# Patient Record
Sex: Male | Born: 1985 | Race: White | Hispanic: No | Marital: Married | Smoking: Former smoker
Health system: Southern US, Community
[De-identification: ages and names within clinical notes are randomized; demographics above are authoritative.]

## PROBLEM LIST (undated history)

## (undated) DIAGNOSIS — F419 Anxiety disorder, unspecified: Secondary | ICD-10-CM

## (undated) HISTORY — DX: Anxiety disorder, unspecified: F41.9

---

## 2016-10-15 ENCOUNTER — Encounter: Payer: Self-pay | Admitting: Gastroenterology

## 2016-11-05 ENCOUNTER — Other Ambulatory Visit: Payer: Self-pay

## 2016-11-24 ENCOUNTER — Telehealth: Payer: Self-pay | Admitting: Gastroenterology

## 2016-11-24 ENCOUNTER — Ambulatory Visit: Payer: Self-pay | Admitting: Gastroenterology

## 2016-11-24 NOTE — Telephone Encounter (Signed)
OK this time.

## 2016-11-24 NOTE — Telephone Encounter (Signed)
Do you want to charge? 

## 2017-01-05 ENCOUNTER — Ambulatory Visit: Payer: Self-pay | Admitting: Gastroenterology

## 2017-02-20 ENCOUNTER — Emergency Department (HOSPITAL_COMMUNITY)
Admission: EM | Admit: 2017-02-20 | Discharge: 2017-02-20 | Disposition: A | Discharge status: Home / Self Care | Payer: BLUE CROSS/BLUE SHIELD | Attending: Emergency Medicine | Admitting: Emergency Medicine

## 2017-02-20 ENCOUNTER — Encounter (HOSPITAL_COMMUNITY): Payer: Self-pay

## 2017-02-20 ENCOUNTER — Emergency Department (HOSPITAL_COMMUNITY): Payer: BLUE CROSS/BLUE SHIELD

## 2017-02-20 DIAGNOSIS — R0602 Shortness of breath: Secondary | ICD-10-CM | POA: Diagnosis present

## 2017-02-20 DIAGNOSIS — Z87891 Personal history of nicotine dependence: Secondary | ICD-10-CM | POA: Insufficient documentation

## 2017-02-20 DIAGNOSIS — R002 Palpitations: Secondary | ICD-10-CM | POA: Diagnosis not present

## 2017-02-20 LAB — BASIC METABOLIC PANEL WITH GFR
Anion gap: 10 (ref 5–15)
BUN: 20 mg/dL (ref 6–20)
CO2: 23 mmol/L (ref 22–32)
Calcium: 9.2 mg/dL (ref 8.9–10.3)
Chloride: 105 mmol/L (ref 101–111)
Creatinine, Ser: 0.91 mg/dL (ref 0.61–1.24)
GFR calc Af Amer: >60 mL/min (ref >60)
GFR calc non Af Amer: >60 mL/min (ref >60)
Glucose, Bld: 115 mg/dL — ABNORMAL HIGH (ref 65–99)
Potassium: 3.1 mmol/L — ABNORMAL LOW (ref 3.5–5.1)
Sodium: 138 mmol/L (ref 135–145)

## 2017-02-20 LAB — URINALYSIS, ROUTINE W REFLEX MICROSCOPIC
BILIRUBIN URINE: NEGATIVE
GLUCOSE, UA: NEGATIVE mg/dL
Hgb urine dipstick: NEGATIVE
KETONES UR: NEGATIVE mg/dL
Leukocytes, UA: NEGATIVE
Nitrite: NEGATIVE
PH: 6 (ref 5.0–8.0)
Protein, ur: NEGATIVE mg/dL
SPECIFIC GRAVITY, URINE: 1.012 (ref 1.005–1.030)

## 2017-02-20 LAB — HEPATIC FUNCTION PANEL
ALK PHOS: 40 U/L (ref 38–126)
ALT: 18 U/L (ref 17–63)
AST: 20 U/L (ref 15–41)
Albumin: 4.1 g/dL (ref 3.5–5.0)
BILIRUBIN DIRECT: 0.2 mg/dL (ref 0.1–0.5)
BILIRUBIN INDIRECT: 1.4 mg/dL — AB (ref 0.3–0.9)
Total Bilirubin: 1.6 mg/dL — ABNORMAL HIGH (ref 0.3–1.2)
Total Protein: 6.6 g/dL (ref 6.5–8.1)

## 2017-02-20 LAB — CBC
HCT: 42 % (ref 39.0–52.0)
Hemoglobin: 14.4 g/dL (ref 13.0–17.0)
MCH: 29 pg (ref 26.0–34.0)
MCHC: 34.3 g/dL (ref 30.0–36.0)
MCV: 84.7 fL (ref 78.0–100.0)
Platelets: 227 K/uL (ref 150–400)
RBC: 4.96 MIL/uL (ref 4.22–5.81)
RDW: 13.3 % (ref 11.5–15.5)
WBC: 7.3 K/uL (ref 4.0–10.5)

## 2017-02-20 LAB — TSH: TSH: 4.642 u[IU]/mL — AB (ref 0.350–4.500)

## 2017-02-20 LAB — T4, FREE: Free T4: 0.91 ng/dL (ref 0.61–1.12)

## 2017-02-20 LAB — TROPONIN I

## 2017-02-20 LAB — MAGNESIUM: Magnesium: 1.9 mg/dL (ref 1.7–2.4)

## 2017-02-20 LAB — D-DIMER, QUANTITATIVE: D-Dimer, Quant: <0.27 ug/mL-FEU (ref 0.00–0.50)

## 2017-02-20 LAB — LIPASE, BLOOD: Lipase: 31 U/L (ref 11–51)

## 2017-02-20 MED ORDER — SODIUM CHLORIDE 0.9 % IV BOLUS (SEPSIS)
1000.0000 mL | Freq: Once | INTRAVENOUS | Status: AC
  Administered 2017-02-20: 1000 mL via INTRAVENOUS

## 2017-02-20 MED ORDER — LORAZEPAM 2 MG/ML IJ SOLN
1.0000 mg | Freq: Once | INTRAMUSCULAR | Status: AC
  Administered 2017-02-20: 1 mg via INTRAVENOUS
  Filled 2017-02-20: qty 1

## 2017-02-20 MED ORDER — POTASSIUM CHLORIDE CRYS ER 20 MEQ PO TBCR
40.0000 meq | EXTENDED_RELEASE_TABLET | Freq: Once | ORAL | Status: AC
  Administered 2017-02-20: 40 meq via ORAL
  Filled 2017-02-20: qty 2

## 2017-02-20 NOTE — Discharge Instructions (Signed)
To find a primary care or specialty doctor please call [phone] or [phone] to access "[facility] Find a Doctor Service." ° °You may also go on the [facility] website at [email or web address] ° °There are also multiple [facility], [facility] practices throughout the [place] that are frequently accepting new patients. You may find a clinic that is close to your home and contact them. ° °[facility] -  °[address] °[place] [address] °[phone] ° ° °[facility] -  °[address] °[place] [address] [phone] ° ° °[facility] - °[address]  °[place] [address] °[phone] ° ° °

## 2017-02-20 NOTE — ED Notes (Signed)
ED Provider at bedside. 

## 2017-02-20 NOTE — ED Triage Notes (Signed)
Pt states that he woke up from his sleep and felt SOB and felt like his chest was tight, states his heart was racing, pt tearful in triage. Denies other cardiac symptoms

## 2017-02-20 NOTE — ED Provider Notes (Signed)
By signing my name below, I, Diona BrownerJennifer Gorman, attest that this documentation has been prepared under the direction and in the presence of Chariti Havel, Layla MawKristen N, DO. Electronically Signed: Diona BrownerJennifer Gorman, ED Scribe. 02/20/17. 4:03 AM.  TIME SEEN: 3:58 AM  CHIEF COMPLAINT: Shortness of Breath  HPI: Brad Lamb is a 31 y.o. male who presents to the Emergency Department complaining of sudden SOB that woke him up from his sleep ~ 3 am tonight. Associated sx include diffuse anterior chest tightness and increased HR. Patient's wife reports that he woke up in a pain and she tried to check his pulse and noticed that it was "pounding" but states that he could not stay still long enough for her to check the rate. 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 However, at work yesterday he exerted himself more than he has recently helping others and may not have eaten enough or drank enough throughout the day. States he came home and his clothes were covered with sweat. His wife also notes, she recently has recovered from pneumonia. Pt denies fever, chills, cough, nausea, and vomiting. He denies diarrhea, bloody stools or melena. No history of PE or DVT. No history of CAD for himself or family members. No lower extremity swelling or pain. Reports he is starting to feel better with time and reassurance. No other aggravating or alleviating factors. No radiation of pain.  ROS: See HPI Constitutional: no fever  Eyes: no drainage  ENT: no runny nose   Cardiovascular: + chest tightness Resp: + SOB  GI: no vomiting GU: no dysuria Integumentary: no rash  Allergy: no hives  Musculoskeletal: no leg swelling  Neurological: no slurred speech ROS otherwise negative  PAST MEDICAL HISTORY/PAST SURGICAL HISTORY:  History reviewed. No pertinent past medical history.  MEDICATIONS:  Prior to Admission medications   Not on File    ALLERGIES:  Allergies   Allergen Reactions  . Sulfa Antibiotics Rash    SOCIAL HISTORY:  Social History  Substance Use Topics  . Smoking status: Former Smoker    Quit date: 09/22/2010  . Smokeless tobacco: Never Used  . Alcohol use No    FAMILY HISTORY: Family History  Problem Relation Age of Onset  . Diverticulitis Mother     EXAM: BP (!) 148/90   Pulse (!) 108   Temp 98.6 F (37 C)   Resp 18   SpO2 100%    CONSTITUTIONAL: Alert and oriented and responds appropriately to questions. Appears Anxious; well-nourished , Afebrile and nontoxic, obese HEAD: Normocephalic EYES: Conjunctivae clear, pupils appear equal, EOMI ENT: normal nose; moist mucous membranes NECK: Supple, no meningismus, no nuchal rigidity, no LAD  CARD: Regular and minimally tachycardic; S1 and S2 appreciated; no murmurs, no clicks, no rubs, no gallops RESP: Normal chest excursion without splinting or tachypnea; breath sounds clear and equal bilaterally; no wheezes, no rhonchi, no rales, no hypoxia or respiratory distress, speaking full sentences ABD/GI: Normal bowel sounds; non-distended; soft, non-tender, no rebound, no guarding, no peritoneal signs, no hepatosplenomegaly BACK:  The back appears normal and is non-tender to palpation, there is no CVA tenderness EXT: Normal ROM in all joints; non-tender to palpation; no edema; normal capillary refill; no cyanosis, no calf tenderness or swelling    SKIN: Normal color for age and race; warm; no rash NEURO: Moves all extremities equally PSYCH: The patient's mood and manner are appropriate. Grooming and personal hygiene are appropriate.  MEDICAL DECISION MAKING: Patient here with sudden onset shortness of breath and palpitations. Has diffuse chest tightness. He has no risk factors for ACS. No risk factors for pulmonary embolus. Mildly tachycardic here. Wife does report that he worked outside more today than he has recently and that his clothes were covered with sweat. She thinks this  could be dehydration. I agree. We'll give IV fluids and check electrolyte levels. EKG shows sinus tachycardia but no other abnormality. Chest x-ray clear. Labs pending. I also think that there is a component of anxiety. Doubt ACS, PE, dissection. He has no infectious symptoms currently.  ED PROGRESS: 4:50 AM  Patient's labs are unremarkable except for potassium of 3.1 which we will give oral placement for. D-dimer negative. Troponin negative. Chest x-ray clear. Patient heart rate had dropped into the 80s but then popped back into the 130s in a sinus tachycardia. He appears very anxious. I suspect that is playing a role in his symptoms. Will give dose of IV Ativan and reassess.   Repeat a shows sinus tachycardia but no other abnormality. Wife now reports that he is also had intermittent right-sided abdominal pain and is worried this could be his gallbladder. He is tender in the right upper quadrant it has a negative Murphy sign but will add on LFTs and lipase. His TSHs come back elevated and we will add on a free T3, free T4 but I do not feel this is the cause of his tachycardia and have advised primary care follow-up for this.   6:50 AM  Pt's urine shows no sign of infection, no ketones. Free T4 is normal. LFTs unremarkable, lipase normal. I feel he is safe to be discharged. His heart rate is in the 80s and he feels more relaxed and more comfortable. He has been given 2 L of fluids. No longer having chest pain or shortness of breath. Suspect some component of dehydration that cause palpitations which worsened his chronic anxiety. Patient and family agree. I feel he is safe to follow-up as an outpatient.  At this time, I do not feel there is any life-threatening condition present. I have reviewed and discussed all results (EKG, imaging, lab, urine as appropriate) and exam findings with patient/family. I have reviewed nursing notes and appropriate previous records.  I feel the patient is safe to be  discharged home without further emergent workup and can continue workup as an outpatient as needed. Discussed usual and customary return precautions. Patient/family verbalize understanding and are comfortable with this plan.  Outpatient follow-up has been provided if needed. All questions have been answered.    EKG Interpretation  Date/Time:  Friday February 20 2017 03:18:52 EDT Ventricular Rate:  102 PR Interval:  164 QRS Duration: 90 QT Interval:  338 QTC Calculation: 440 R Axis:   45 Text Interpretation:  Sinus tachycardia Otherwise normal ECG No old tracing to compare Confirmed by Micalah Cabezas, Baxter Hire [phone]) on 02/20/2017 3:35:36 AM       EKG Interpretation  Date/Time:  Friday February 20 2017 04:51:45 EDT Ventricular Rate:  108 PR Interval:  164 QRS Duration: 86 QT Interval:  311 QTC Calculation: 417 R Axis:   56 Text Interpretation:  Sinus tachycardia Confirmed by Rochele Raring [phone]) on 02/20/2017 4:53:31 AM       I personally performed the services described in this documentation, which was scribed in my presence. The recorded information has been reviewed and is accurate.     Alnita Aybar, Layla Maw, DO 02/20/17 [phone]

## 2017-02-20 NOTE — ED Notes (Signed)
Pt c/o chest tightness, HR 130 on the cardiac monitor. Repeat EKG performed, EDP aware.

## 2017-02-21 LAB — T3, FREE: T3 FREE: 4.7 pg/mL — AB (ref 2.0–4.4)

## 2017-03-02 ENCOUNTER — Encounter (HOSPITAL_COMMUNITY): Payer: Self-pay | Admitting: *Deleted

## 2017-03-02 DIAGNOSIS — R197 Diarrhea, unspecified: Secondary | ICD-10-CM | POA: Diagnosis not present

## 2017-03-02 DIAGNOSIS — R1011 Right upper quadrant pain: Secondary | ICD-10-CM | POA: Diagnosis present

## 2017-03-02 DIAGNOSIS — Z87891 Personal history of nicotine dependence: Secondary | ICD-10-CM | POA: Insufficient documentation

## 2017-03-02 LAB — URINALYSIS, ROUTINE W REFLEX MICROSCOPIC
Bilirubin Urine: NEGATIVE
GLUCOSE, UA: NEGATIVE mg/dL
Hgb urine dipstick: NEGATIVE
Ketones, ur: 5 mg/dL — AB
LEUKOCYTES UA: NEGATIVE
Nitrite: NEGATIVE
PH: 6 (ref 5.0–8.0)
Protein, ur: NEGATIVE mg/dL
SPECIFIC GRAVITY, URINE: 1.005 (ref 1.005–1.030)

## 2017-03-02 LAB — COMPREHENSIVE METABOLIC PANEL
ALBUMIN: 4.9 g/dL (ref 3.5–5.0)
ALK PHOS: 39 U/L (ref 38–126)
ALT: 14 U/L — AB (ref 17–63)
AST: 17 U/L (ref 15–41)
Anion gap: 10 (ref 5–15)
BILIRUBIN TOTAL: 2.6 mg/dL — AB (ref 0.3–1.2)
BUN: 14 mg/dL (ref 6–20)
CHLORIDE: 105 mmol/L (ref 101–111)
CO2: 23 mmol/L (ref 22–32)
Calcium: 10.4 mg/dL — ABNORMAL HIGH (ref 8.9–10.3)
Creatinine, Ser: 1.04 mg/dL (ref 0.61–1.24)
GFR calc Af Amer: >60 mL/min (ref >60)
GFR calc non Af Amer: >60 mL/min (ref >60)
Glucose, Bld: 95 mg/dL (ref 65–99)
POTASSIUM: 3.5 mmol/L (ref 3.5–5.1)
SODIUM: 138 mmol/L (ref 135–145)
Total Protein: 7.1 g/dL (ref 6.5–8.1)

## 2017-03-02 LAB — CBC
HEMATOCRIT: 42.7 % (ref 39.0–52.0)
Hemoglobin: 14.9 g/dL (ref 13.0–17.0)
MCH: 29.3 pg (ref 26.0–34.0)
MCHC: 34.9 g/dL (ref 30.0–36.0)
MCV: 84.1 fL (ref 78.0–100.0)
Platelets: 258 10*3/uL (ref 150–400)
RBC: 5.08 MIL/uL (ref 4.22–5.81)
RDW: 13 % (ref 11.5–15.5)
WBC: 7.5 10*3/uL (ref 4.0–10.5)

## 2017-03-02 LAB — LIPASE, BLOOD: Lipase: 29 U/L (ref 11–51)

## 2017-03-02 NOTE — ED Triage Notes (Signed)
Pt is here for RUQ pain and some epigastric pain which began last night.  Pain comes in waves and is 1/10 at this time.  Pain has been severe at times.  Worse after eating.  Pt is concerned that this could be related to his gallbladder.

## 2017-03-02 NOTE — ED Notes (Signed)
Called to triage pt, pt did not answer.

## 2017-03-03 ENCOUNTER — Emergency Department (HOSPITAL_COMMUNITY): Payer: BLUE CROSS/BLUE SHIELD

## 2017-03-03 ENCOUNTER — Emergency Department (HOSPITAL_COMMUNITY)
Admission: EM | Admit: 2017-03-03 | Discharge: 2017-03-03 | Disposition: A | Discharge status: Home / Self Care | Payer: BLUE CROSS/BLUE SHIELD | Attending: Emergency Medicine | Admitting: Emergency Medicine

## 2017-03-03 DIAGNOSIS — K529 Noninfective gastroenteritis and colitis, unspecified: Secondary | ICD-10-CM

## 2017-03-03 DIAGNOSIS — R1013 Epigastric pain: Secondary | ICD-10-CM

## 2017-03-03 LAB — I-STAT TROPONIN, ED: Troponin i, poc: 0 ng/mL (ref 0.00–0.08)

## 2017-03-03 LAB — BILIRUBIN, FRACTIONATED(TOT/DIR/INDIR)
BILIRUBIN DIRECT: 0.3 mg/dL (ref 0.1–0.5)
BILIRUBIN INDIRECT: 2.2 mg/dL — AB (ref 0.3–0.9)
BILIRUBIN TOTAL: 2.5 mg/dL — AB (ref 0.3–1.2)

## 2017-03-03 NOTE — ED Notes (Signed)
ED Provider at bedside. 

## 2017-03-03 NOTE — ED Notes (Signed)
Patient transported to Ultrasound 

## 2017-03-03 NOTE — Discharge Instructions (Signed)
Tonight your evaluated for continued abdominal pain and elevated bilirubin level today.  Her bilirubin level was found to be 2.6.  Her ultrasound shows that you have no gallbladder disease.  No cirrhosis or ascites.  You do have fatty liver, but this should not contribute to your elevated bilirubin. I recommend that you have a repeat blood test in 3-5 days.  He states that you do have an appointment with your PCP on Friday, this would be a perfect time to have it level repeated.  Also keep your appointment with the history, neurologist, for further evaluation of the chronic diarrhea

## 2017-03-03 NOTE — ED Notes (Signed)
Pt ambulated to restroom. 

## 2017-03-03 NOTE — ED Provider Notes (Signed)
[facility]-EMERGENCY DEPT Provider Note   CSN: [id number] Arrival date & time: 03/02/17  2136     History   Chief Complaint Chief Complaint  Patient presents with  . Abdominal Pain    epigastric pain    HPI Brad Lamb is a 31 y.o. male.  This a 31 year old male who is presenting with right upper quadrant epigastric pain, nausea, without vomiting.  Pain becomes worse after eating.  He also endorses left anterior wall chest pain is reproducible in nature but is very concerned for cardiac disease. Patient was seen June 1 for palpitations and has not felt right since. Wife accompanies the patient, who has his discharge paperwork and is very concerned that they were not informed that his bilirubin was 1.6.  At that time and she is very concerned that he has gallbladder disease. He does state that he's had diarrhea for the past 2-3 months.  He does have an appointment with her bowel or gastroenterology for evaluation as his primary care physician ordered an outpatient CT scan several weeks ago that showed he has some inflammation in the left lower intestines. He states that his bowel movements are loose and very light, or clay-colored frequent 4-5 times a day and occur immediately after eating      History reviewed. No pertinent past medical history.  There are no active problems to display for this patient.   History reviewed. No pertinent surgical history.     Home Medications    Prior to Admission medications   Not on File    Family History Family History  Problem Relation Age of Onset  . Diverticulitis Mother     Social History Social History  Substance Use Topics  . Smoking status: Former Smoker    Quit date: 09/22/2010  . Smokeless tobacco: Never Used  . Alcohol use No     Allergies   Sulfa antibiotics   Review of Systems Review of Systems  Constitutional: Negative for fever.  Respiratory: Negative for shortness of breath.   Cardiovascular: Positive for  chest pain.  Gastrointestinal: Positive for abdominal pain, diarrhea and nausea. Negative for vomiting.  Genitourinary: Negative for dysuria.  Psychiatric/Behavioral: The patient is nervous/anxious.   All other systems reviewed and are negative.    Physical Exam Updated Vital Signs BP 138/88   Pulse 92   Temp 98.5 F (36.9 C) (Oral)   Resp 11   SpO2 100%   Physical Exam  Constitutional: He appears well-developed and well-nourished.  HENT:  Head: Normocephalic and atraumatic.  Eyes: Pupils are equal, round, and reactive to light.  Neck: Normal range of motion.  Cardiovascular: Normal rate and regular rhythm.   Pulmonary/Chest: Effort normal and breath sounds normal. He exhibits tenderness.  Abdominal: Soft. Bowel sounds are normal. He exhibits no distension. There is tenderness in the right upper quadrant and epigastric area. There is no rigidity and no guarding.  Musculoskeletal: Normal range of motion.  Neurological: He is alert.  Skin: Skin is warm.  Psychiatric: He has a normal mood and affect.  Nursing note and vitals reviewed.    ED Treatments / Results  Labs (all labs ordered are listed, but only abnormal results are displayed) Labs Reviewed  COMPREHENSIVE METABOLIC PANEL - Abnormal; Notable for the following:       Result Value   Calcium 10.4 (*)    ALT 14 (*)    Total Bilirubin 2.6 (*)    All other components within normal limits  URINALYSIS, ROUTINE W  REFLEX MICROSCOPIC - Abnormal; Notable for the following:    Color, Urine STRAW (*)    Ketones, ur 5 (*)    All other components within normal limits  BILIRUBIN, FRACTIONATED(TOT/DIR/INDIR) - Abnormal; Notable for the following:    Total Bilirubin 2.5 (*)    Indirect Bilirubin 2.2 (*)    All other components within normal limits  LIPASE, BLOOD  CBC  HEPATITIS PANEL, ACUTE  I-STAT TROPOININ, ED    EKG  EKG Interpretation None       Radiology [place] Abdomen Complete  Result Date: 03/03/2017 CLINICAL  DATA:  Acute onset of elevated bilirubin. Initial encounter. EXAM: ABDOMEN ULTRASOUND COMPLETE COMPARISON:  None. FINDINGS: Gallbladder: No gallstones or wall thickening visualized. No sonographic Murphy sign noted by sonographer. Common bile duct: Diameter: 0.6 cm, within normal limits in caliber. Liver: No focal lesion identified. Diffusely increased parenchymal echogenicity, compatible with fatty infiltration. There is mild focal fatty sparing about the gallbladder fossa. IVC: No abnormality visualized. Pancreas: Visualized portion unremarkable. Spleen: Size and appearance within normal limits. Right Kidney: Length: 10.1 cm. Echogenicity within normal limits. No mass or hydronephrosis visualized. Left Kidney: Length: 11.8 cm. Echogenicity within normal limits. No mass or hydronephrosis visualized. Abdominal aorta: No aneurysm visualized. Other findings: None. IMPRESSION: 1. No acute abnormality seen in the abdomen. 2. Diffuse fatty infiltration within the liver. Electronically Signed   By: Roanna RaiderJeffery  Chang M.D.   On: 03/03/2017 05:24    Procedures Procedures (including critical care time)  Medications Ordered in ED Medications - No data to display   Initial Impression / Assessment and Plan / ED Course  I have reviewed the triage vital signs and the nursing notes.  Pertinent labs & imaging results that were available during my care of the patient were reviewed by me and considered in my medical decision making (see chart for details).      Patient's bilirubin has increased from 1.62.6 and 7 days.  This is of concern, I will obtain a ultrasound of the gallbladder and upper abdomen as well as hepatitis screen Included in 30 minutes answering patient's questions and reassuring him . Hepatitis screen is pending and he is aware of this.  His primary care physician can follow-up with results of this test on Friday when he has a scheduled appointment.  I do recommend that he get a repeat bilirubin drawn  at that time as well and that he should keep his appointment with the gastroenterologist for further evaluation of his chronic diarrhea. Final Clinical Impressions(s) / ED Diagnoses   Final diagnoses:  Hyperbilirubinemia  Epigastric pain  Chronic diarrhea    New Prescriptions New Prescriptions   No medications on file     Earley FavorSchulz, Lilia Letterman, NP 03/03/17 0313    Earley FavorSchulz, Zasha Belleau, NP 03/03/17 0557    Earley FavorSchulz, Jessamine Barcia, NP 03/03/17 [id number]    Shon BatonHorton, Courtney F, MD 03/03/17 [phone]

## 2017-03-04 LAB — HEPATITIS PANEL, ACUTE
Hep A IgM: NEGATIVE
Hep B C IgM: NEGATIVE
Hepatitis B Surface Ag: NEGATIVE

## 2017-03-13 ENCOUNTER — Ambulatory Visit: Payer: BLUE CROSS/BLUE SHIELD | Admitting: Physician Assistant

## 2017-03-31 ENCOUNTER — Encounter (INDEPENDENT_AMBULATORY_CARE_PROVIDER_SITE_OTHER): Payer: Self-pay

## 2017-03-31 ENCOUNTER — Ambulatory Visit (INDEPENDENT_AMBULATORY_CARE_PROVIDER_SITE_OTHER): Payer: BLUE CROSS/BLUE SHIELD | Admitting: Nurse Practitioner

## 2017-03-31 ENCOUNTER — Encounter: Payer: Self-pay | Admitting: Nurse Practitioner

## 2017-03-31 VITALS — BP 100/66 | HR 80 | Ht 66.0 in | Wt 222.8 lb

## 2017-03-31 DIAGNOSIS — K529 Noninfective gastroenteritis and colitis, unspecified: Secondary | ICD-10-CM | POA: Diagnosis not present

## 2017-03-31 DIAGNOSIS — G8929 Other chronic pain: Secondary | ICD-10-CM | POA: Diagnosis not present

## 2017-03-31 DIAGNOSIS — R1011 Right upper quadrant pain: Secondary | ICD-10-CM | POA: Diagnosis not present

## 2017-03-31 DIAGNOSIS — K760 Fatty (change of) liver, not elsewhere classified: Secondary | ICD-10-CM

## 2017-03-31 NOTE — Progress Notes (Signed)
HPI:   "Brad Lamb" is a 31 yo male, new to the practice, here for evaluation of chronic loose stool and dull RLQ pain . He sometimes has 7-8 loose non-bloody stools a day. No fevers, joint aches or skin rashes.  Seems like he always has an awareness of something in his RLQ but the pain usually comes after eating. Pain is not affected by movement , it does not radiate to  other parts of his body . [facility]uated [facility] 2015 for the pain and labs and u/a done. Seen again Jan 2018. Pain felt to be musculoskeletal in nature but. CT scan was ordered and revealed fatty depostion in wal of  cecum and proximal ascending colon suggesting post inflammatory fatty change. No bowel wall thickening and appendix normal.   Brad Lamb was seen twice in ED in June.    Visit early June for SOB and chest pain, sinus tachycardia. No acute findings on EKG. Troponin negative, d-dimer negative, chest x-ray negative. Anxiety was felt to be playing a role, given IV Ativan.   Second ED visit 6/12 for RUQ pain, nausea, chest pain. He and wife were concerned they weren't told about elevated bilirubin of 1.4 found at time of last ED visit. Repeat LFTs showed tbili of 2.6. U/S obtained and revealed fatty liver, nothing acute. Advised to see GI.   Brad Lamb recently made some dietary changes to omitt fried foods, carbonated beverages, processed foods and red meat. With dietary changes he was able to lose 15-20 pounds. His chronic diarrhea has nearly resolved as has the RLQ discomfort. Bowel movements are now solid for the most part.   Chronic hyperbilirubinemia. Tbili 1.6 in January >>> 1.4 early June >>>2.5 mid June. Hyperbilirubinemia predominantly Indirect (2.2 of 2.5 total) which is probably Gilberts.    Past Medical History:  Diagnosis Date  . Anxiety      History reviewed. No pertinent surgical history. Family History  Problem Relation Age of Onset  . Diverticulitis Mother   . Gallbladder disease Sister   . Gallbladder  disease Sister   . Appendicitis Sister   . Colon cancer Neg Hx   . Esophageal cancer Neg Hx   . Pancreatic cancer Neg Hx   . Stomach cancer Neg Hx    Social History  Substance Use Topics  . Smoking status: Former Smoker    Quit date: 09/22/2010  . Smokeless tobacco: Never Used  . Alcohol use No   No current outpatient prescriptions on file.   No current facility-administered medications for this visit.    Allergies  Allergen Reactions  . Sulfa Antibiotics Rash     Review of Systems: All systems reviewed and negative except where noted in HPI.    Physical Exam: BP 100/66   Pulse 80   Ht 5\' 6"  (1.676 m)   Wt 222 lb 12.8 oz (101.1 kg)   BMI 35.96 kg/m  Constitutional:  Well-developed, white  male in no acute distress. Psychiatric: Normal mood and affect. Behavior is normal. EENT: Pupils normal.  Conjunctivae are normal. No scleral icterus. Neck supple.  Cardiovascular: Normal rate, regular rhythm. No edema Pulmonary/chest: Effort normal and breath sounds normal. No wheezing, rales or rhonchi. Abdominal: Soft, nondistended. Nontender. Bowel sounds active throughout. There are no masses palpable. No hepatomegaly. Lymphadenopathy: No cervical adenopathy noted. Neurological: Alert and oriented to person place and time. Skin: Skin is warm and dry. No rashes noted.   ASSESSMENT AND PLAN:  1. 31 yo male with  chronic RLQ pain / loose stools worse after eating. His symptoms have nearly resolved after some major dietary changes such as elimination of processed foods, fried foods, carbonated beverages ). He lost 15-20 pounds with this new diet but weight has now stabilized . His labs have been unremarkable, patient looks healthy, feels well.  CT scan of the abdomen and pelvis in January showed some fatty deposition involving the cecum and the ascending colon, probably a postinflammatory changes. It seems unlikely that his has IBD or other significant pathology. At this point I  suspect he has IBS.  -Patient will call us ASAP if he has breakthrough RLQ discomfort and/ or recurrent loose stools. He also knows to contact us for any rectal bleeding , nausea, vomiting or other symptoms   2. Hyperbilirubinemia, predominantly indirect. 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  -since LFTs have slowly risen will repeat in a couple of weeks. Shouldn't rise above 3-4 with Gilberts  3. Fatty liver disease by imaging. Normal. LFT. Discussed treatment. He is working on weight loss -will add fasting triglycerides to LFTs in a couple of weeks.   Willette Cluster, NP  03/31/2017, 10:02 AM

## 2017-03-31 NOTE — Patient Instructions (Addendum)
If you are age 32 or older, your body mass index should be between 23-30. Your Body mass index is 35.96 kg/m. If this is out of the aforementioned range listed, please consider follow up with your Primary Care Provider.  If you are age 32 or younger, your body mass index should be between 19-25. Your Body mass index is 35.96 kg/m. If this is out of the aformentioned range listed, please consider follow up with your Primary Care Provider.   Your physician has requested that you go to the basement for the following lab work on April 14, 2017. FASTING triglycerides Hepatic panel  Thank you for choosing me and [facility].   Willette ClusterPaula Guenther, NP

## 2017-04-02 NOTE — Progress Notes (Signed)
Initial assessment and plan as noted 

## 2017-04-27 ENCOUNTER — Telehealth: Payer: Self-pay | Admitting: Nurse Practitioner

## 2017-04-27 NOTE — Telephone Encounter (Signed)
Patient has a return of his abdominal symptoms. He has pain and sometimes diarrhea with multiple stools. Some days he will have only one stool but it is loose. He understands IBS, but he is concerned he may have an undiagnosed IBD. He would like to know definitively. He has never been on any antispasmodics either. Please advise on the next step.

## 2017-05-04 NOTE — Telephone Encounter (Signed)
So his symptoms had totally resolved with dietary changes. Did symptoms come back because he went back to eating same foods as before?  If symptoms returned despite new healthier diet then yes I think we need to evaluated further. He has had this diarrhea for years but if worse than usual lets rule out c-diff. Otherwise a colonoscopy isn't unreasonable. Just let me know. Thanks

## 2017-05-05 NOTE — Telephone Encounter (Signed)
No answer.  Mailbox is full.

## 2017-05-06 ENCOUNTER — Telehealth: Payer: Self-pay | Admitting: Nurse Practitioner

## 2017-05-06 NOTE — Telephone Encounter (Signed)
I spoke with the patient again. He has stopped all dairy products. He purchased IBgard. He reports resolution of all disagreeable symptoms. He states he will call if he acutely worsens.

## 2017-05-07 NOTE — Telephone Encounter (Signed)
The patient asks for a prescription for IBgard in case his insurance covers OTC's that are prescribed. Or other anti spasmodic that is prescription to take on a PRN basis. He is symptom free by eliminating all dairy and taking IB gard on a PRN basis. IBgard is expensive.

## 2017-05-13 ENCOUNTER — Other Ambulatory Visit: Payer: Self-pay

## 2017-05-13 NOTE — Telephone Encounter (Signed)
Beth, it sounds like dairy was causing the problem? Maybe just elimination of dairy is enough and he doesn't need the IBGuard?? I don't think that writing a rx for IBGuard will help insurance pay but I will be glad to try. Also, maybe we have samples. Thanks

## 2017-05-13 NOTE — Telephone Encounter (Signed)
I have given the patient coupons.

## 2018-03-12 IMAGING — US US ABDOMEN COMPLETE
1 series · 14 of 25 positions shown · non-contrast
Comparison: None.

CLINICAL DATA: Acute onset of elevated bilirubin. Initial
encounter.

EXAM:
ABDOMEN ULTRASOUND COMPLETE

[Series 1: us abdomen complete · 0.26mm/px · 14 of 81 slices shown]
[im 1/81]
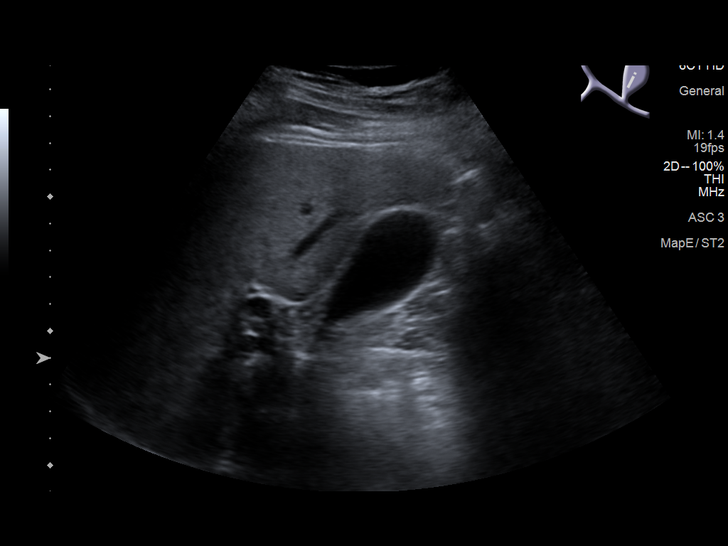
[im 7/81]
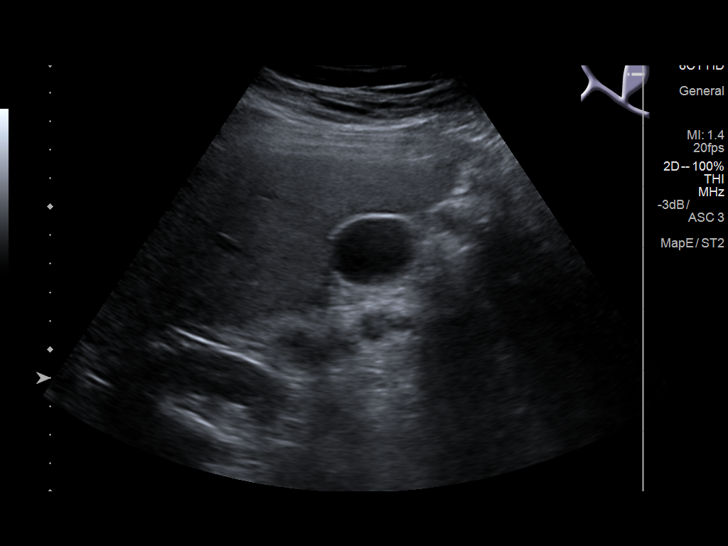
[im 14/81]
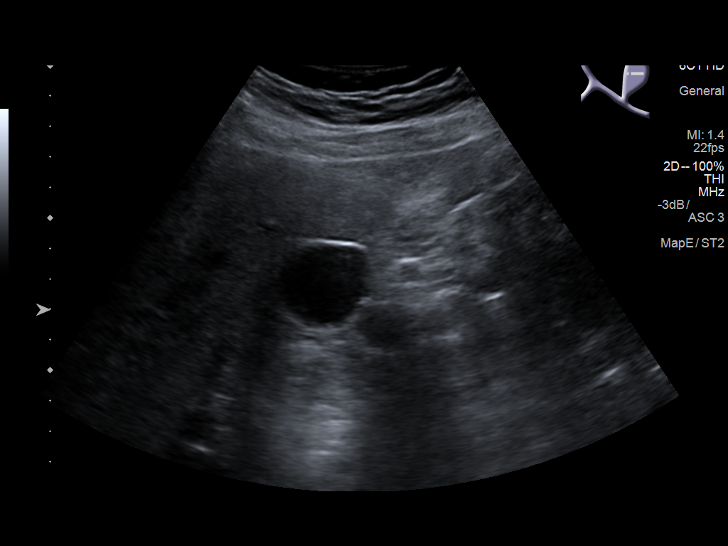
[im 21/81]
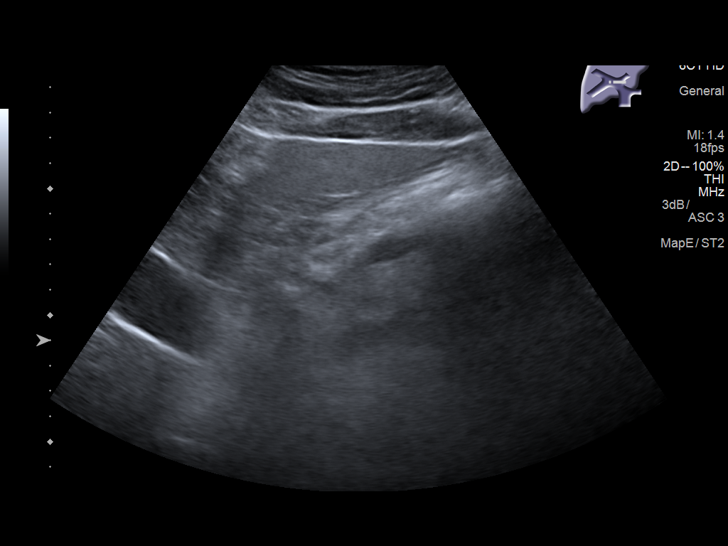
[im 27/81]
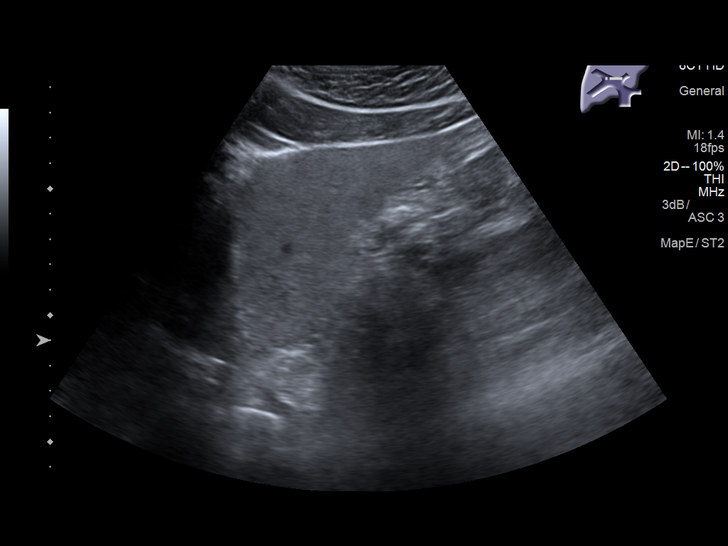
[im 31/81]
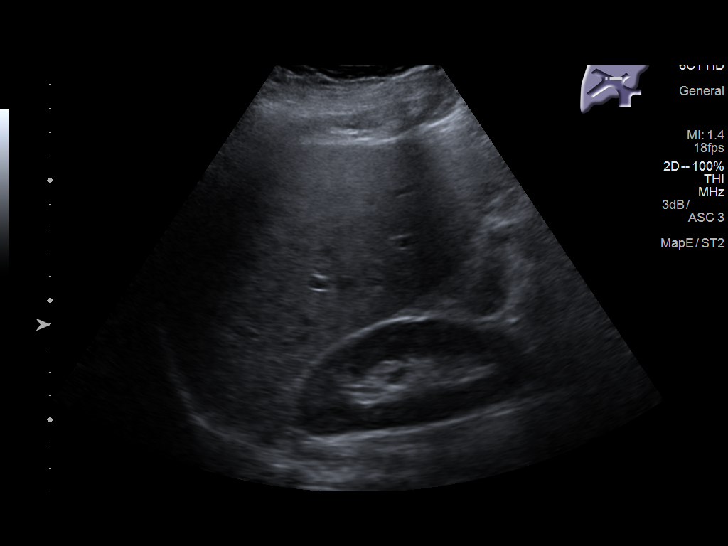
[im 37/81]
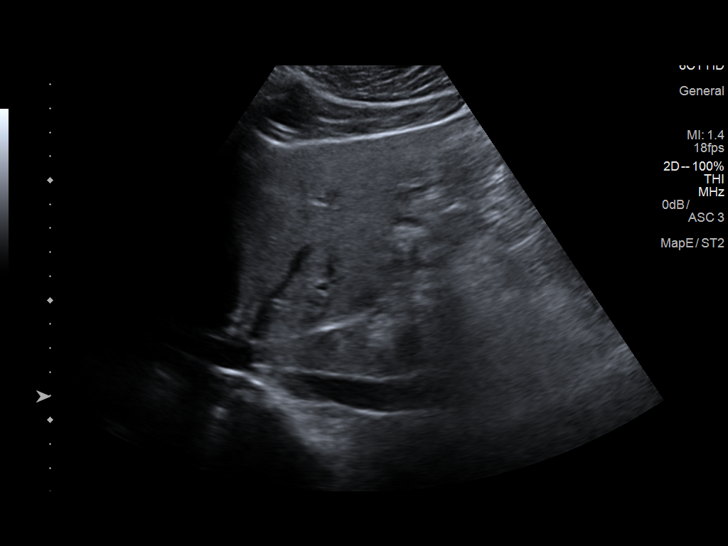
[im 44/81]
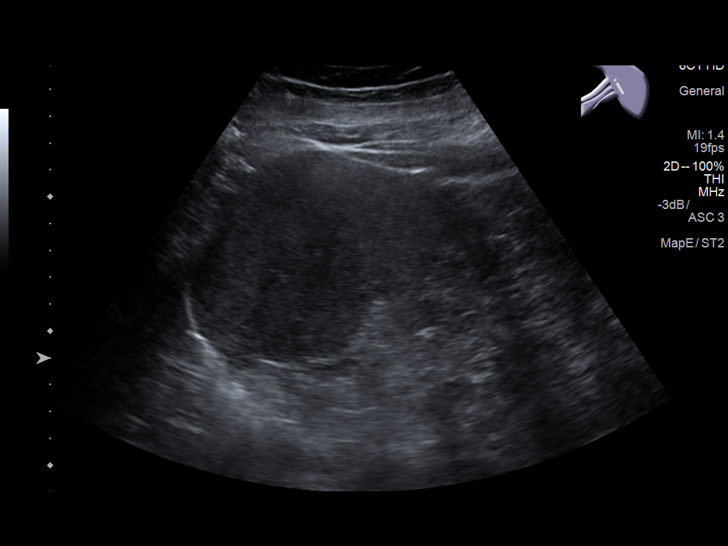
[im 51/81]
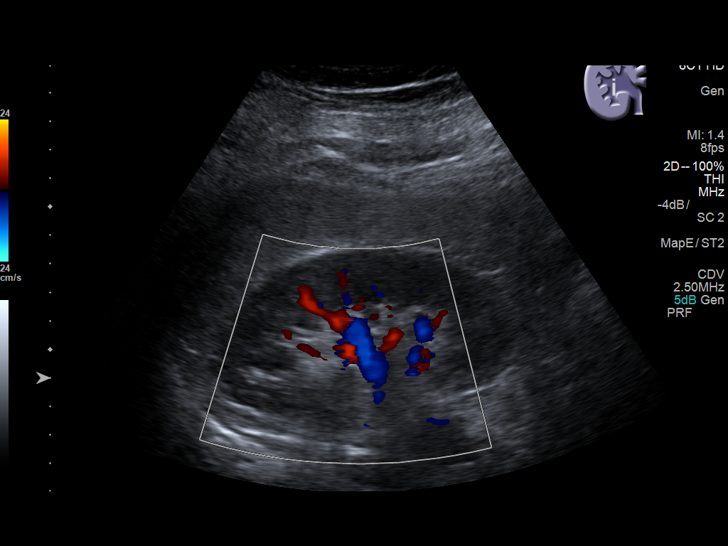
[im 54/81]
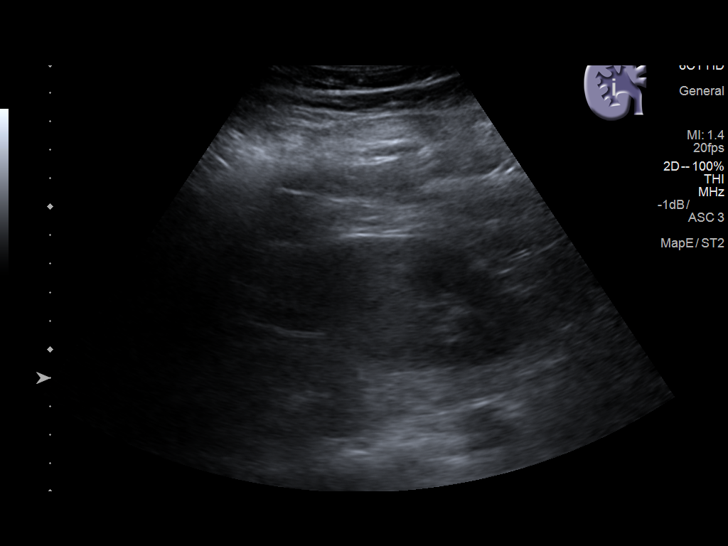
[im 61/81]
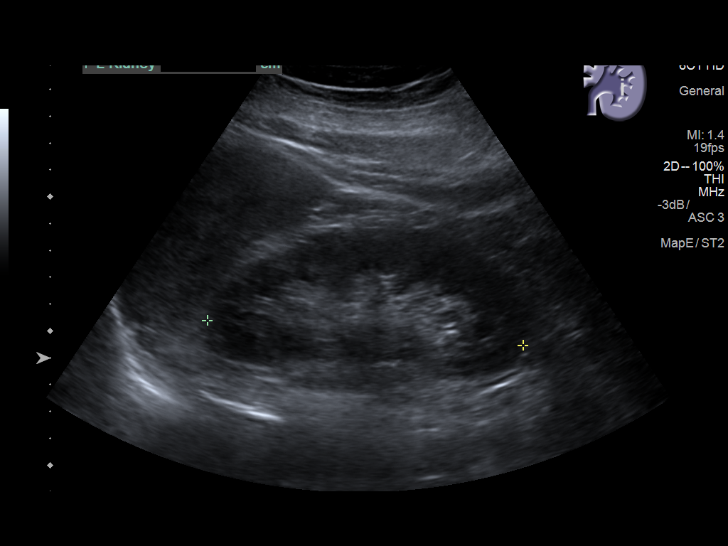
[im 67/81]
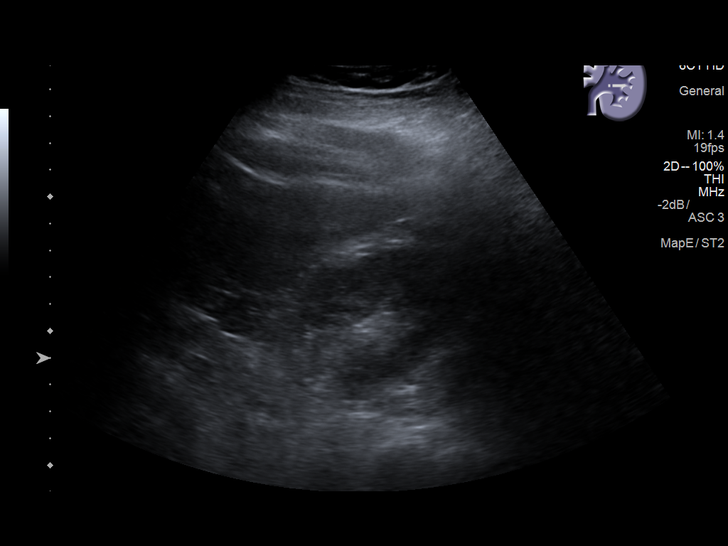
[im 74/81]
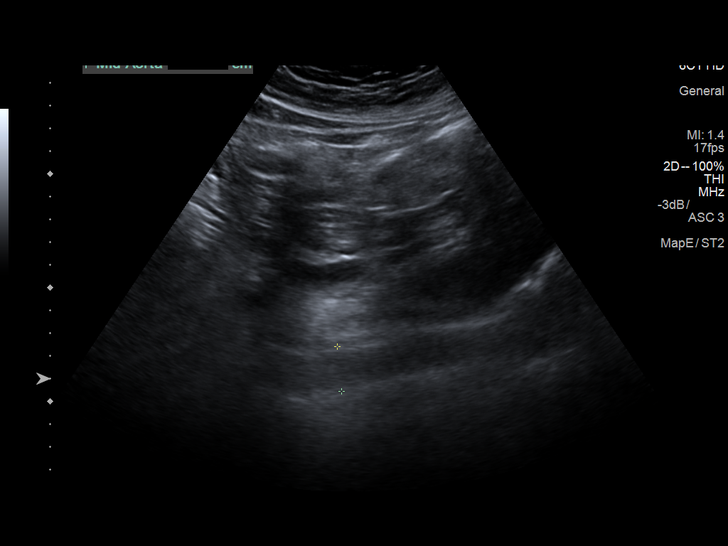
[im 81/81]
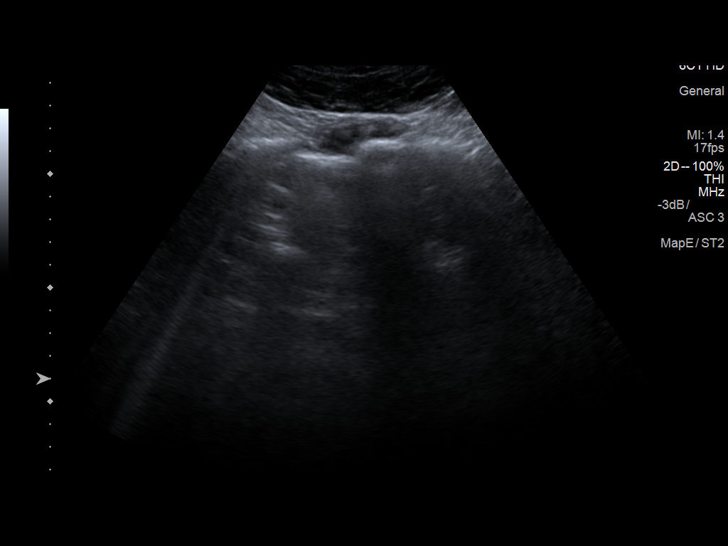

[14 of 25 positions shown; findings below may reference images not displayed]

FINDINGS: Gallbladder: No gallstones or wall thickening visualized. No
sonographic Murphy sign noted by sonographer.

Common bile duct: Diameter: 0.6 cm, within normal limits in caliber.

Liver: No focal lesion identified. Diffusely increased parenchymal
echogenicity, compatible with fatty infiltration. There is mild
focal fatty sparing about the gallbladder fossa.

IVC: No abnormality visualized.

Pancreas: Visualized portion unremarkable.

Spleen: Size and appearance within normal limits.

Right Kidney: Length: 10.1 cm. Echogenicity within normal limits. No
mass or hydronephrosis visualized.

Left Kidney: Length: 11.8 cm. Echogenicity within normal limits. No
mass or hydronephrosis visualized.

Abdominal aorta: No aneurysm visualized.

Other findings: None.
IMPRESSION: 1. No acute abnormality seen in the abdomen.
2. Diffuse fatty infiltration within the liver.

## 2019-02-12 IMAGING — DX DG CHEST 2V
2 series · 2 of 2 positions shown · non-contrast
Comparison: None.

CLINICAL DATA: Chest tightness

EXAM:
CHEST  2 VIEW

[chest pa]
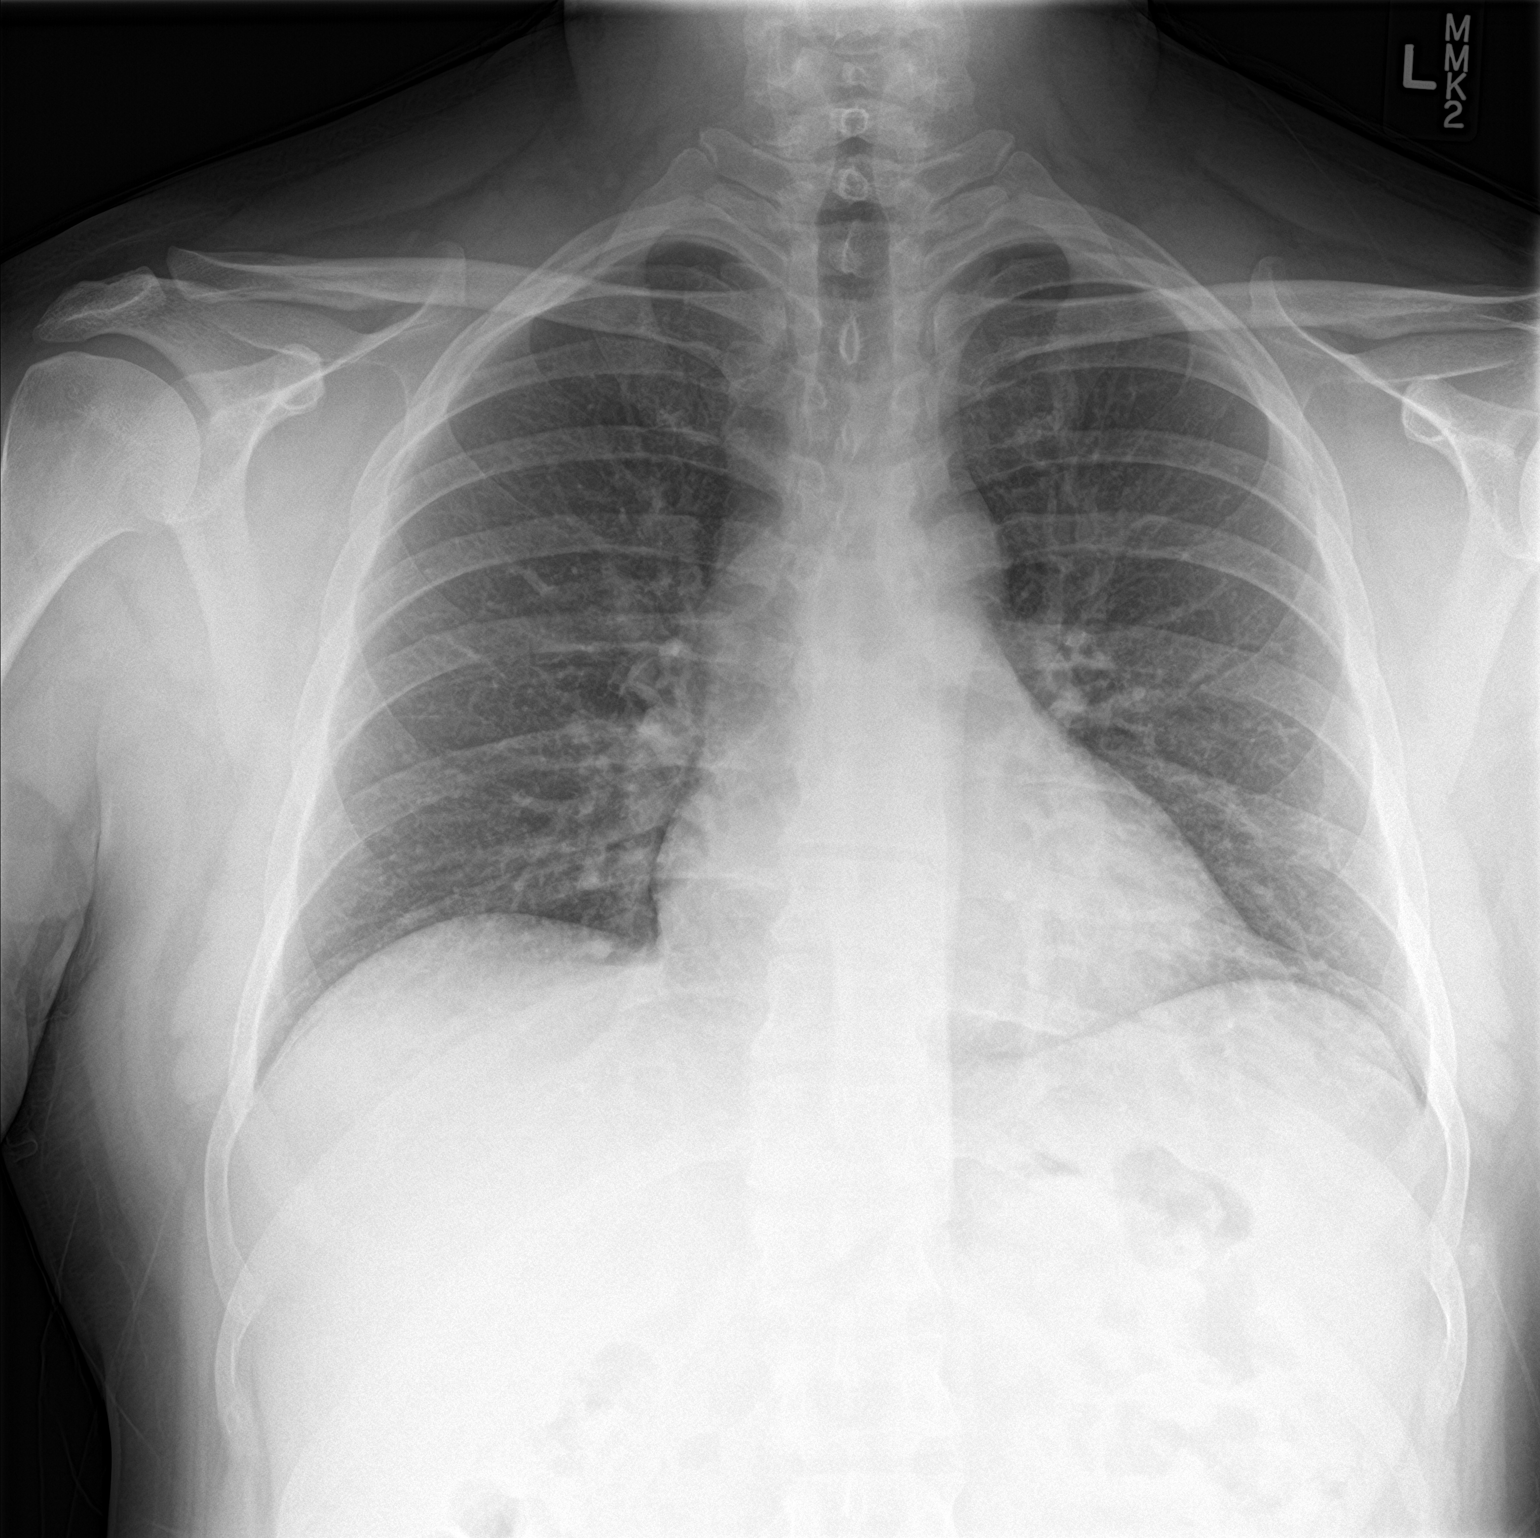

[chest lat]
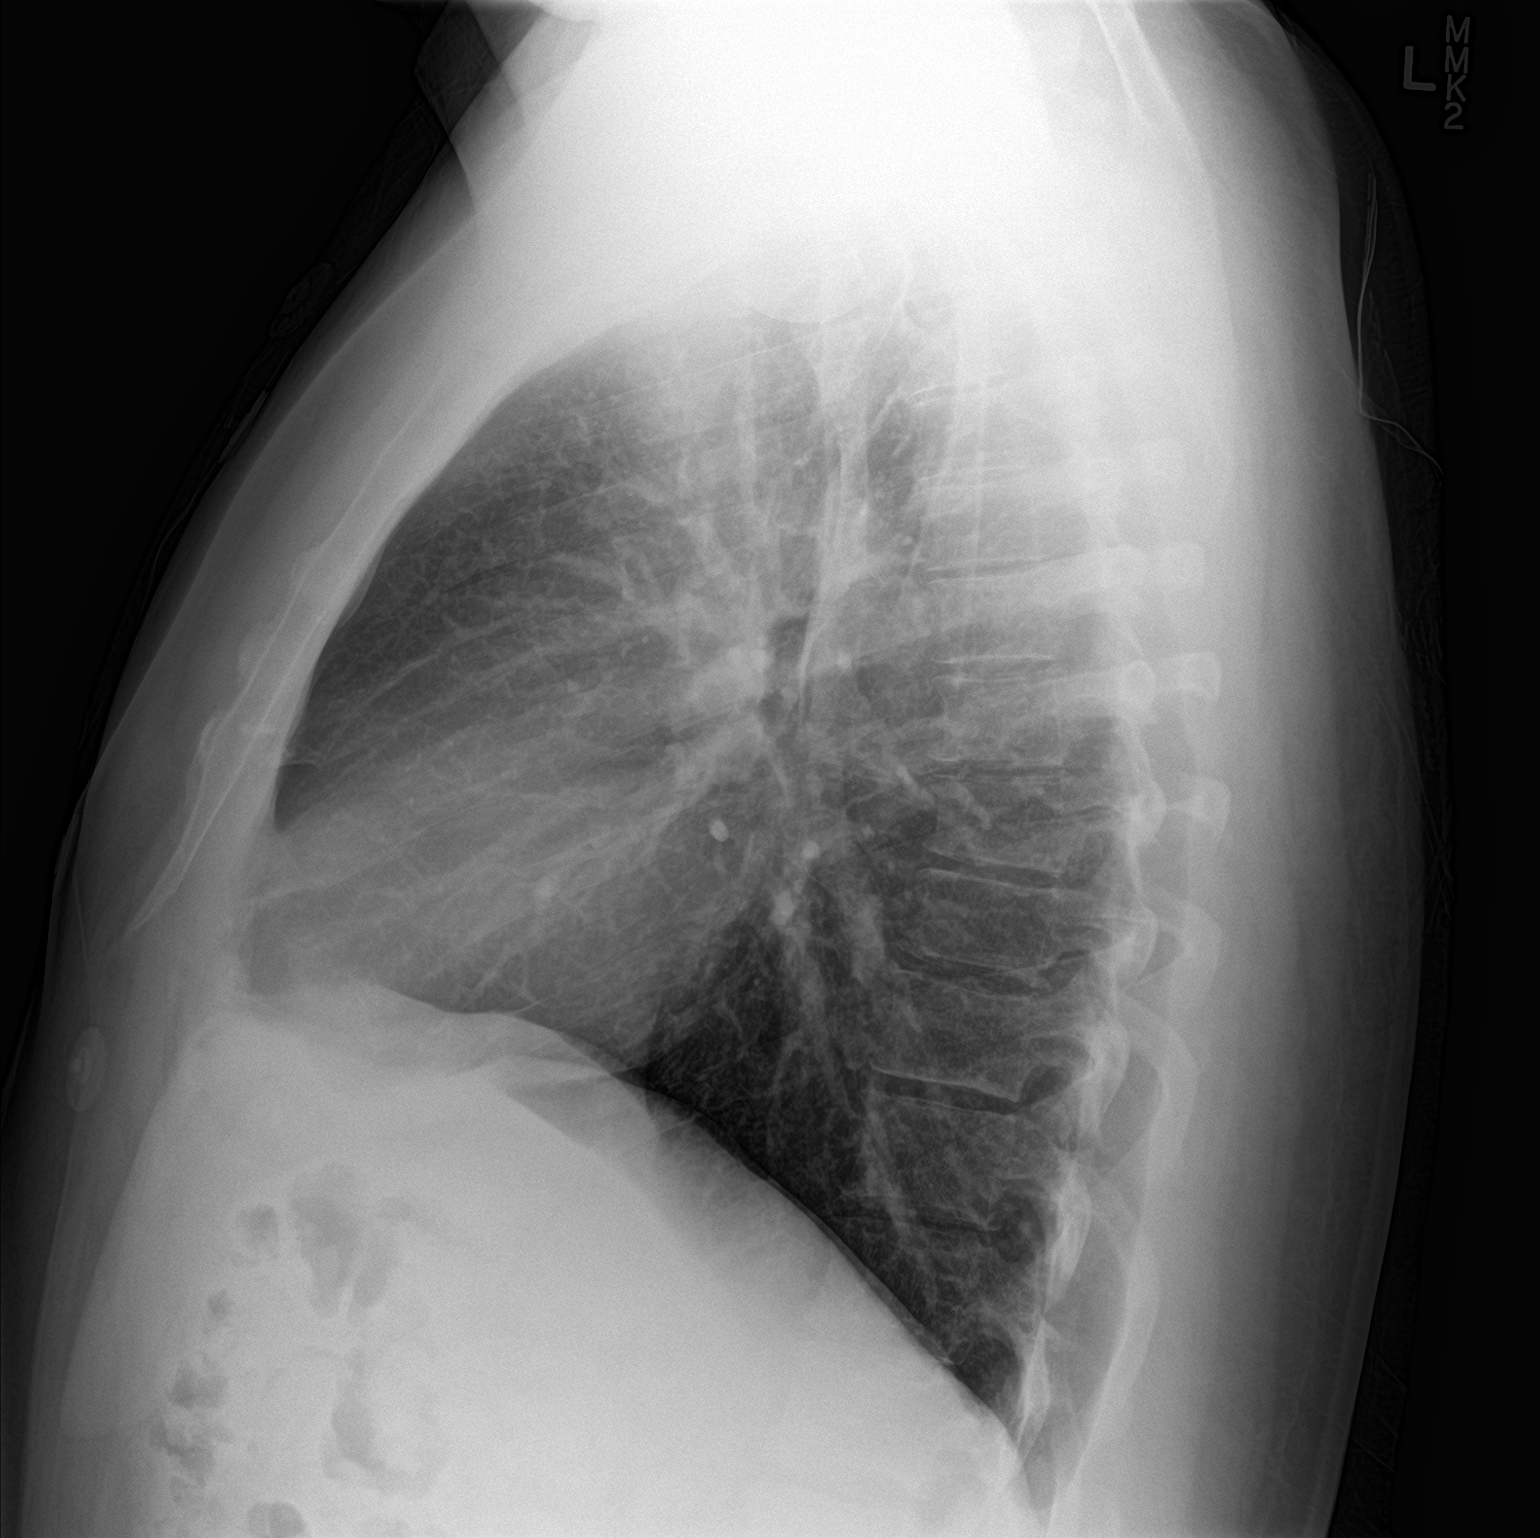

[2 of 2 positions shown; findings below may reference images not displayed]

FINDINGS: The heart size and mediastinal contours are within normal limits.
Both lungs are clear. The visualized skeletal structures are
unremarkable.
IMPRESSION: No active cardiopulmonary disease.

## 2019-03-23 DEATH — deceased
